# Patient Record
Sex: Female | Born: 1990 | Race: White | Hispanic: No | Marital: Single | Smoking: Never smoker
Health system: Southern US, Community
[De-identification: ages and names within clinical notes are randomized; demographics above are authoritative.]

## PROBLEM LIST (undated history)

## (undated) ENCOUNTER — Emergency Department (HOSPITAL_BASED_OUTPATIENT_CLINIC_OR_DEPARTMENT_OTHER): Admission: EM

## (undated) DIAGNOSIS — F32A Depression, unspecified: Secondary | ICD-10-CM

---
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
## 2012-01-07 ENCOUNTER — Ambulatory Visit: Payer: BC Managed Care – PPO | Admitting: Psychology

## 2012-01-07 DIAGNOSIS — F909 Attention-deficit hyperactivity disorder, unspecified type: Secondary | ICD-10-CM

## 2012-01-28 ENCOUNTER — Ambulatory Visit (INDEPENDENT_AMBULATORY_CARE_PROVIDER_SITE_OTHER): Payer: BC Managed Care – PPO | Admitting: Psychology

## 2012-01-28 DIAGNOSIS — F4320 Adjustment disorder, unspecified: Secondary | ICD-10-CM

## 2012-02-04 ENCOUNTER — Ambulatory Visit (INDEPENDENT_AMBULATORY_CARE_PROVIDER_SITE_OTHER): Payer: BC Managed Care – PPO | Admitting: Psychology

## 2012-02-04 DIAGNOSIS — F4323 Adjustment disorder with mixed anxiety and depressed mood: Secondary | ICD-10-CM

## 2012-02-11 ENCOUNTER — Ambulatory Visit (INDEPENDENT_AMBULATORY_CARE_PROVIDER_SITE_OTHER): Payer: BC Managed Care – PPO | Admitting: Psychology

## 2012-02-11 DIAGNOSIS — F4323 Adjustment disorder with mixed anxiety and depressed mood: Secondary | ICD-10-CM

## 2012-02-12 ENCOUNTER — Institutional Professional Consult (permissible substitution): Payer: BC Managed Care – PPO | Admitting: Pediatrics

## 2012-03-04 ENCOUNTER — Institutional Professional Consult (permissible substitution) (INDEPENDENT_AMBULATORY_CARE_PROVIDER_SITE_OTHER): Payer: BC Managed Care – PPO | Admitting: Pediatrics

## 2012-03-04 DIAGNOSIS — R279 Unspecified lack of coordination: Secondary | ICD-10-CM

## 2012-03-04 DIAGNOSIS — F909 Attention-deficit hyperactivity disorder, unspecified type: Secondary | ICD-10-CM

## 2012-03-08 ENCOUNTER — Ambulatory Visit (INDEPENDENT_AMBULATORY_CARE_PROVIDER_SITE_OTHER): Payer: BC Managed Care – PPO | Admitting: Psychology

## 2012-03-08 DIAGNOSIS — F4323 Adjustment disorder with mixed anxiety and depressed mood: Secondary | ICD-10-CM

## 2012-03-09 ENCOUNTER — Ambulatory Visit (INDEPENDENT_AMBULATORY_CARE_PROVIDER_SITE_OTHER): Payer: BC Managed Care – PPO | Admitting: Psychology

## 2012-03-09 DIAGNOSIS — F4323 Adjustment disorder with mixed anxiety and depressed mood: Secondary | ICD-10-CM

## 2012-03-17 ENCOUNTER — Ambulatory Visit (INDEPENDENT_AMBULATORY_CARE_PROVIDER_SITE_OTHER): Payer: BC Managed Care – PPO | Admitting: Psychology

## 2012-03-17 DIAGNOSIS — F4323 Adjustment disorder with mixed anxiety and depressed mood: Secondary | ICD-10-CM

## 2012-03-24 ENCOUNTER — Ambulatory Visit: Payer: BC Managed Care – PPO | Admitting: Psychology

## 2012-03-24 ENCOUNTER — Ambulatory Visit (INDEPENDENT_AMBULATORY_CARE_PROVIDER_SITE_OTHER): Payer: BC Managed Care – PPO | Admitting: Psychology

## 2012-03-24 DIAGNOSIS — F4323 Adjustment disorder with mixed anxiety and depressed mood: Secondary | ICD-10-CM

## 2012-03-31 ENCOUNTER — Ambulatory Visit (INDEPENDENT_AMBULATORY_CARE_PROVIDER_SITE_OTHER): Payer: BC Managed Care – PPO | Admitting: Psychology

## 2012-03-31 DIAGNOSIS — F4323 Adjustment disorder with mixed anxiety and depressed mood: Secondary | ICD-10-CM

## 2012-04-07 ENCOUNTER — Ambulatory Visit (INDEPENDENT_AMBULATORY_CARE_PROVIDER_SITE_OTHER): Payer: BC Managed Care – PPO | Admitting: Psychology

## 2012-04-07 DIAGNOSIS — F4323 Adjustment disorder with mixed anxiety and depressed mood: Secondary | ICD-10-CM

## 2012-04-14 ENCOUNTER — Ambulatory Visit (INDEPENDENT_AMBULATORY_CARE_PROVIDER_SITE_OTHER): Payer: BC Managed Care – PPO | Admitting: Psychology

## 2012-04-14 DIAGNOSIS — F4323 Adjustment disorder with mixed anxiety and depressed mood: Secondary | ICD-10-CM

## 2012-04-28 ENCOUNTER — Ambulatory Visit: Payer: BC Managed Care – PPO | Admitting: Psychology

## 2012-04-28 DIAGNOSIS — F4323 Adjustment disorder with mixed anxiety and depressed mood: Secondary | ICD-10-CM

## 2012-05-12 ENCOUNTER — Ambulatory Visit (INDEPENDENT_AMBULATORY_CARE_PROVIDER_SITE_OTHER): Payer: BC Managed Care – PPO | Admitting: Psychology

## 2012-05-12 DIAGNOSIS — F4323 Adjustment disorder with mixed anxiety and depressed mood: Secondary | ICD-10-CM

## 2012-05-26 ENCOUNTER — Ambulatory Visit (INDEPENDENT_AMBULATORY_CARE_PROVIDER_SITE_OTHER): Payer: BC Managed Care – PPO | Admitting: Psychology

## 2012-05-26 DIAGNOSIS — F4323 Adjustment disorder with mixed anxiety and depressed mood: Secondary | ICD-10-CM

## 2012-06-04 ENCOUNTER — Institutional Professional Consult (permissible substitution) (INDEPENDENT_AMBULATORY_CARE_PROVIDER_SITE_OTHER): Payer: BC Managed Care – PPO | Admitting: Pediatrics

## 2012-06-04 DIAGNOSIS — F909 Attention-deficit hyperactivity disorder, unspecified type: Secondary | ICD-10-CM

## 2012-06-04 DIAGNOSIS — R279 Unspecified lack of coordination: Secondary | ICD-10-CM

## 2012-06-16 ENCOUNTER — Ambulatory Visit (INDEPENDENT_AMBULATORY_CARE_PROVIDER_SITE_OTHER): Payer: BC Managed Care – PPO | Admitting: Psychology

## 2012-06-16 DIAGNOSIS — F4320 Adjustment disorder, unspecified: Secondary | ICD-10-CM

## 2012-07-20 ENCOUNTER — Ambulatory Visit (INDEPENDENT_AMBULATORY_CARE_PROVIDER_SITE_OTHER): Payer: BC Managed Care – PPO | Admitting: Psychology

## 2012-07-20 DIAGNOSIS — F4323 Adjustment disorder with mixed anxiety and depressed mood: Secondary | ICD-10-CM

## 2012-08-10 ENCOUNTER — Ambulatory Visit (INDEPENDENT_AMBULATORY_CARE_PROVIDER_SITE_OTHER): Payer: BC Managed Care – PPO | Admitting: Psychology

## 2012-08-10 DIAGNOSIS — F4323 Adjustment disorder with mixed anxiety and depressed mood: Secondary | ICD-10-CM

## 2012-08-24 ENCOUNTER — Ambulatory Visit: Payer: BC Managed Care – PPO | Admitting: Psychology

## 2012-08-24 DIAGNOSIS — F909 Attention-deficit hyperactivity disorder, unspecified type: Secondary | ICD-10-CM

## 2012-08-31 ENCOUNTER — Institutional Professional Consult (permissible substitution) (INDEPENDENT_AMBULATORY_CARE_PROVIDER_SITE_OTHER): Payer: BC Managed Care – PPO | Admitting: Pediatrics

## 2012-08-31 DIAGNOSIS — F909 Attention-deficit hyperactivity disorder, unspecified type: Secondary | ICD-10-CM

## 2012-08-31 DIAGNOSIS — R279 Unspecified lack of coordination: Secondary | ICD-10-CM

## 2012-11-19 ENCOUNTER — Institutional Professional Consult (permissible substitution) (INDEPENDENT_AMBULATORY_CARE_PROVIDER_SITE_OTHER): Payer: BC Managed Care – PPO | Admitting: Pediatrics

## 2012-11-19 DIAGNOSIS — F909 Attention-deficit hyperactivity disorder, unspecified type: Secondary | ICD-10-CM

## 2012-11-19 DIAGNOSIS — R279 Unspecified lack of coordination: Secondary | ICD-10-CM

## 2013-03-08 ENCOUNTER — Institutional Professional Consult (permissible substitution): Payer: BC Managed Care – PPO | Admitting: Pediatrics

## 2013-03-08 ENCOUNTER — Institutional Professional Consult (permissible substitution) (INDEPENDENT_AMBULATORY_CARE_PROVIDER_SITE_OTHER): Payer: BC Managed Care – PPO | Admitting: Pediatrics

## 2013-03-08 DIAGNOSIS — F909 Attention-deficit hyperactivity disorder, unspecified type: Secondary | ICD-10-CM

## 2013-03-08 DIAGNOSIS — R279 Unspecified lack of coordination: Secondary | ICD-10-CM

## 2013-06-28 ENCOUNTER — Institutional Professional Consult (permissible substitution) (INDEPENDENT_AMBULATORY_CARE_PROVIDER_SITE_OTHER): Payer: BC Managed Care – PPO | Admitting: Pediatrics

## 2013-06-28 DIAGNOSIS — R279 Unspecified lack of coordination: Secondary | ICD-10-CM

## 2013-06-28 DIAGNOSIS — F909 Attention-deficit hyperactivity disorder, unspecified type: Secondary | ICD-10-CM

## 2015-01-05 ENCOUNTER — Other Ambulatory Visit (HOSPITAL_COMMUNITY)
Admission: RE | Admit: 2015-01-05 | Discharge: 2015-01-05 | Disposition: A | Discharge status: Home / Self Care | Payer: 59 | Source: Ambulatory Visit | Attending: Family Medicine | Admitting: Family Medicine

## 2015-01-05 ENCOUNTER — Other Ambulatory Visit: Payer: Self-pay | Admitting: Physician Assistant

## 2015-01-05 DIAGNOSIS — Z01419 Encounter for gynecological examination (general) (routine) without abnormal findings: Secondary | ICD-10-CM | POA: Diagnosis present

## 2015-01-09 LAB — CYTOLOGY - PAP

## 2015-01-13 ENCOUNTER — Emergency Department (HOSPITAL_COMMUNITY): Payer: 59

## 2015-01-13 ENCOUNTER — Emergency Department (HOSPITAL_COMMUNITY)
Admission: EM | Admit: 2015-01-13 | Discharge: 2015-01-13 | Disposition: A | Discharge status: Home / Self Care | Payer: 59 | Attending: Emergency Medicine | Admitting: Emergency Medicine

## 2015-01-13 ENCOUNTER — Encounter (HOSPITAL_COMMUNITY): Payer: Self-pay | Admitting: Nurse Practitioner

## 2015-01-13 DIAGNOSIS — Y92410 Unspecified street and highway as the place of occurrence of the external cause: Secondary | ICD-10-CM | POA: Insufficient documentation

## 2015-01-13 DIAGNOSIS — S4992XA Unspecified injury of left shoulder and upper arm, initial encounter: Secondary | ICD-10-CM | POA: Insufficient documentation

## 2015-01-13 DIAGNOSIS — S29001A Unspecified injury of muscle and tendon of front wall of thorax, initial encounter: Secondary | ICD-10-CM | POA: Insufficient documentation

## 2015-01-13 DIAGNOSIS — Z79899 Other long term (current) drug therapy: Secondary | ICD-10-CM | POA: Diagnosis not present

## 2015-01-13 DIAGNOSIS — Z3202 Encounter for pregnancy test, result negative: Secondary | ICD-10-CM | POA: Diagnosis not present

## 2015-01-13 DIAGNOSIS — S8992XA Unspecified injury of left lower leg, initial encounter: Secondary | ICD-10-CM | POA: Insufficient documentation

## 2015-01-13 DIAGNOSIS — Y998 Other external cause status: Secondary | ICD-10-CM | POA: Diagnosis not present

## 2015-01-13 DIAGNOSIS — Y9389 Activity, other specified: Secondary | ICD-10-CM | POA: Diagnosis not present

## 2015-01-13 DIAGNOSIS — S3991XA Unspecified injury of abdomen, initial encounter: Secondary | ICD-10-CM | POA: Diagnosis not present

## 2015-01-13 LAB — CBC WITH DIFFERENTIAL/PLATELET
Basophils Absolute: 0 10*3/uL (ref 0.0–0.1)
Basophils Relative: 0 % (ref 0–1)
Eosinophils Absolute: 0 10*3/uL (ref 0.0–0.7)
Eosinophils Relative: 0 % (ref 0–5)
HCT: 40.6 % (ref 36.0–46.0)
Hemoglobin: 14 g/dL (ref 12.0–15.0)
Lymphocytes Relative: 16 % (ref 12–46)
Lymphs Abs: 2.6 10*3/uL (ref 0.7–4.0)
MCH: 29 pg (ref 26.0–34.0)
MCHC: 34.5 g/dL (ref 30.0–36.0)
MCV: 84.2 fL (ref 78.0–100.0)
Monocytes Absolute: 0.9 10*3/uL (ref 0.1–1.0)
Monocytes Relative: 6 % (ref 3–12)
Neutro Abs: 12.5 10*3/uL — ABNORMAL HIGH (ref 1.7–7.7)
Neutrophils Relative %: 78 % — ABNORMAL HIGH (ref 43–77)
Platelets: 327 10*3/uL (ref 150–400)
RBC: 4.82 MIL/uL (ref 3.87–5.11)
RDW: 13 % (ref 11.5–15.5)
WBC: 16 10*3/uL — ABNORMAL HIGH (ref 4.0–10.5)

## 2015-01-13 LAB — URINALYSIS, ROUTINE W REFLEX MICROSCOPIC
Bilirubin Urine: NEGATIVE
Glucose, UA: NEGATIVE mg/dL
Hgb urine dipstick: NEGATIVE
Ketones, ur: 15 mg/dL — AB
Nitrite: NEGATIVE
Protein, ur: 30 mg/dL — AB
Specific Gravity, Urine: 1.025 (ref 1.005–1.030)
Urobilinogen, UA: 0.2 mg/dL (ref 0.0–1.0)
pH: 5.5 (ref 5.0–8.0)

## 2015-01-13 LAB — COMPREHENSIVE METABOLIC PANEL
ALT: 22 U/L (ref 14–54)
AST: 19 U/L (ref 15–41)
Albumin: 3.7 g/dL (ref 3.5–5.0)
Alkaline Phosphatase: 52 U/L (ref 38–126)
Anion gap: 8 (ref 5–15)
BUN: 11 mg/dL (ref 6–20)
CO2: 23 mmol/L (ref 22–32)
Calcium: 9 mg/dL (ref 8.9–10.3)
Chloride: 107 mmol/L (ref 101–111)
Creatinine, Ser: 0.81 mg/dL (ref 0.44–1.00)
GFR calc Af Amer: >60 mL/min (ref >60)
GFR calc non Af Amer: >60 mL/min (ref >60)
Glucose, Bld: 85 mg/dL (ref 65–99)
Potassium: 3.5 mmol/L (ref 3.5–5.1)
Sodium: 138 mmol/L (ref 135–145)
Total Bilirubin: 0.6 mg/dL (ref 0.3–1.2)
Total Protein: 6.9 g/dL (ref 6.5–8.1)

## 2015-01-13 LAB — URINE MICROSCOPIC-ADD ON

## 2015-01-13 LAB — POC URINE PREG, ED: Preg Test, Ur: NEGATIVE

## 2015-01-13 MED ORDER — IOHEXOL 300 MG/ML  SOLN
80.0000 mL | Freq: Once | INTRAMUSCULAR | Status: AC | PRN
  Administered 2015-01-13: 80 mL via INTRAVENOUS

## 2015-01-13 MED ORDER — HYDROCODONE-ACETAMINOPHEN 5-325 MG PO TABS: 1.0000 | ORAL_TABLET | ORAL | Status: DC | PRN

## 2015-01-13 MED ORDER — KETOROLAC TROMETHAMINE 30 MG/ML IJ SOLN
30.0000 mg | Freq: Once | INTRAMUSCULAR | Status: AC
  Administered 2015-01-13: 30 mg via INTRAVENOUS
  Filled 2015-01-13: qty 1
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
## 2015-01-13 NOTE — ED Notes (Signed)
Pt returned from X-ray.  
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
## 2019-10-15 ENCOUNTER — Ambulatory Visit: Payer: 59 | Attending: Internal Medicine

## 2019-10-15 DIAGNOSIS — Z23 Encounter for immunization: Secondary | ICD-10-CM

## 2019-10-15 NOTE — Progress Notes (Signed)
   Covid-19 Vaccination Clinic  Name:  Stephanie Johnston    MRN: [id number] DOB: 1991/05/06  10/15/2019  Stephanie Johnston was observed post Covid-19 immunization for 15 minutes without incident. She was provided with Vaccine Information Sheet and instruction to access the V-Safe system.   Stephanie Johnston was instructed to call 911 with any severe reactions post vaccine: Marland Kitchen Difficulty breathing  . Swelling of face and throat  . A fast heartbeat  . A bad rash all over body  . Dizziness and weakness   Immunizations Administered    Name Date Dose VIS Date Route   Pfizer COVID-19 Vaccine 10/15/2019  2:50 PM 0.3 mL 06/24/2019 Intramuscular   Manufacturer: [employer], [employer]   Lot: [id number]   NDC: [address]-2

## 2019-11-09 ENCOUNTER — Ambulatory Visit: Payer: 59 | Attending: Internal Medicine

## 2019-11-09 DIAGNOSIS — Z23 Encounter for immunization: Secondary | ICD-10-CM

## 2019-11-09 NOTE — Progress Notes (Signed)
   Covid-19 Vaccination Clinic  Name:  Stephanie Johnston    MRN: [id number] DOB: May 09, 1991  11/09/2019  [name] was observed post Covid-19 immunization for 15 minutes without incident. She was provided with Vaccine Information Sheet and instruction to access the V-Safe system.   [name] was instructed to call 911 with any severe reactions post vaccine: Marland Kitchen Difficulty breathing  . Swelling of face and throat  . A fast heartbeat  . A bad rash all over body  . Dizziness and weakness   Immunizations Administered    Name Date Dose VIS Date Route   Pfizer COVID-19 Vaccine 11/09/2019  3:06 PM 0.3 mL 09/07/2018 Intramuscular   Manufacturer: [employer], [employer]   Lot: [id number]   NDC: [address]-3

## 2022-04-17 DIAGNOSIS — N390 Urinary tract infection, site not specified: Secondary | ICD-10-CM | POA: Diagnosis not present

## 2022-04-17 DIAGNOSIS — R350 Frequency of micturition: Secondary | ICD-10-CM | POA: Diagnosis not present

## 2022-05-22 DIAGNOSIS — M722 Plantar fascial fibromatosis: Secondary | ICD-10-CM | POA: Diagnosis not present

## 2022-05-26 DIAGNOSIS — M722 Plantar fascial fibromatosis: Secondary | ICD-10-CM | POA: Diagnosis not present

## 2022-06-21 DIAGNOSIS — J069 Acute upper respiratory infection, unspecified: Secondary | ICD-10-CM | POA: Diagnosis not present

## 2022-06-30 DIAGNOSIS — R5383 Other fatigue: Secondary | ICD-10-CM | POA: Diagnosis not present

## 2022-06-30 DIAGNOSIS — Z113 Encounter for screening for infections with a predominantly sexual mode of transmission: Secondary | ICD-10-CM | POA: Diagnosis not present

## 2022-06-30 DIAGNOSIS — F419 Anxiety disorder, unspecified: Secondary | ICD-10-CM | POA: Diagnosis not present

## 2022-06-30 DIAGNOSIS — Z124 Encounter for screening for malignant neoplasm of cervix: Secondary | ICD-10-CM | POA: Diagnosis not present

## 2022-06-30 DIAGNOSIS — F902 Attention-deficit hyperactivity disorder, combined type: Secondary | ICD-10-CM | POA: Diagnosis not present

## 2022-06-30 DIAGNOSIS — F321 Major depressive disorder, single episode, moderate: Secondary | ICD-10-CM | POA: Diagnosis not present

## 2022-06-30 DIAGNOSIS — Z0000 Encounter for general adult medical examination without abnormal findings: Secondary | ICD-10-CM | POA: Diagnosis not present

## 2022-06-30 DIAGNOSIS — Z23 Encounter for immunization: Secondary | ICD-10-CM | POA: Diagnosis not present

## 2022-06-30 DIAGNOSIS — Z13220 Encounter for screening for lipoid disorders: Secondary | ICD-10-CM | POA: Diagnosis not present

## 2022-08-08 DIAGNOSIS — F902 Attention-deficit hyperactivity disorder, combined type: Secondary | ICD-10-CM | POA: Diagnosis not present

## 2022-08-08 DIAGNOSIS — F411 Generalized anxiety disorder: Secondary | ICD-10-CM | POA: Diagnosis not present

## 2022-08-08 DIAGNOSIS — F321 Major depressive disorder, single episode, moderate: Secondary | ICD-10-CM | POA: Diagnosis not present

## 2022-08-08 DIAGNOSIS — Z30011 Encounter for initial prescription of contraceptive pills: Secondary | ICD-10-CM | POA: Diagnosis not present

## 2022-09-09 DIAGNOSIS — F321 Major depressive disorder, single episode, moderate: Secondary | ICD-10-CM | POA: Diagnosis not present

## 2022-09-09 DIAGNOSIS — F411 Generalized anxiety disorder: Secondary | ICD-10-CM | POA: Diagnosis not present

## 2022-09-09 DIAGNOSIS — F902 Attention-deficit hyperactivity disorder, combined type: Secondary | ICD-10-CM | POA: Diagnosis not present

## 2022-10-08 DIAGNOSIS — F411 Generalized anxiety disorder: Secondary | ICD-10-CM | POA: Diagnosis not present

## 2022-10-08 DIAGNOSIS — F321 Major depressive disorder, single episode, moderate: Secondary | ICD-10-CM | POA: Diagnosis not present

## 2022-10-08 DIAGNOSIS — F902 Attention-deficit hyperactivity disorder, combined type: Secondary | ICD-10-CM | POA: Diagnosis not present

## 2022-12-23 DIAGNOSIS — F321 Major depressive disorder, single episode, moderate: Secondary | ICD-10-CM | POA: Diagnosis not present

## 2022-12-23 DIAGNOSIS — Z87440 Personal history of urinary (tract) infections: Secondary | ICD-10-CM | POA: Diagnosis not present

## 2022-12-23 DIAGNOSIS — F411 Generalized anxiety disorder: Secondary | ICD-10-CM | POA: Diagnosis not present

## 2022-12-23 DIAGNOSIS — F902 Attention-deficit hyperactivity disorder, combined type: Secondary | ICD-10-CM | POA: Diagnosis not present

## 2023-03-27 DIAGNOSIS — S99911A Unspecified injury of right ankle, initial encounter: Secondary | ICD-10-CM | POA: Diagnosis not present

## 2023-05-21 DIAGNOSIS — Z3009 Encounter for other general counseling and advice on contraception: Secondary | ICD-10-CM | POA: Diagnosis not present

## 2023-05-25 DIAGNOSIS — Z309 Encounter for contraceptive management, unspecified: Secondary | ICD-10-CM | POA: Diagnosis not present

## 2023-05-29 DIAGNOSIS — F321 Major depressive disorder, single episode, moderate: Secondary | ICD-10-CM | POA: Diagnosis not present

## 2023-05-29 DIAGNOSIS — F419 Anxiety disorder, unspecified: Secondary | ICD-10-CM | POA: Diagnosis not present

## 2023-05-29 DIAGNOSIS — F902 Attention-deficit hyperactivity disorder, combined type: Secondary | ICD-10-CM | POA: Diagnosis not present

## 2023-08-18 DIAGNOSIS — J069 Acute upper respiratory infection, unspecified: Secondary | ICD-10-CM | POA: Diagnosis not present

## 2023-08-25 DIAGNOSIS — J069 Acute upper respiratory infection, unspecified: Secondary | ICD-10-CM | POA: Diagnosis not present

## 2023-08-25 DIAGNOSIS — R051 Acute cough: Secondary | ICD-10-CM | POA: Diagnosis not present

## 2023-10-06 DIAGNOSIS — Z309 Encounter for contraceptive management, unspecified: Secondary | ICD-10-CM | POA: Diagnosis not present

## 2023-10-22 ENCOUNTER — Encounter (HOSPITAL_COMMUNITY): Payer: Self-pay | Admitting: Obstetrics and Gynecology

## 2023-10-22 NOTE — Progress Notes (Signed)
 Spoke w/ via phone for pre-op interview--- [facility] needs dos---- UPT per anesthesia. Surgeon orders requested 10/20/23.        Lab results------ COVID test -----patient states asymptomatic no test needed Arrive at -------0950 NPO after MN NO Solid Food.  Clear liquids from MN until---0850 Pre-Surgery Ensure or G2:  Med rec completed Medications to take morning of surgery ----- Wellbutrin and Xanax PRN Diabetic medication -----  GLP1 agonist last dose: GLP1 instructions:  Patient instructed no nail polish to be worn day of surgery Patient instructed to bring photo id and insurance card day of surgery Patient aware to have Driver (ride ) / caregiver    for 24 hours after surgery - Mother Val Wormley Patient Special Instructions ----- shower with antibacterial soap Pre-Op special Instructions -----  Patient verbalized understanding of instructions that were given at this phone interview. Patient denies chest pain, sob, fever, cough at the interview.

## 2023-10-27 ENCOUNTER — Other Ambulatory Visit: Payer: Self-pay | Admitting: Obstetrics and Gynecology

## 2023-10-27 DIAGNOSIS — Z302 Encounter for sterilization: Secondary | ICD-10-CM

## 2023-10-27 NOTE — Anesthesia Preprocedure Evaluation (Signed)
 Anesthesia Evaluation  Patient identified by MRN, date of birth, ID band Patient awake    Reviewed: Allergy & Precautions, NPO status , Patient's Chart, lab work & pertinent test results  Airway Mallampati: II  TM Distance: >3 FB Neck ROM: Full    Dental no notable dental hx. (+) Teeth Intact, Dental Advisory Given   Pulmonary neg pulmonary ROS   Pulmonary exam normal breath sounds clear to auscultation       Cardiovascular negative cardio ROS Normal cardiovascular exam Rhythm:Regular Rate:Normal     Neuro/Psych  PSYCHIATRIC DISORDERS  Depression    negative neurological ROS     GI/Hepatic negative GI ROS, Neg liver ROS,,,  Endo/Other  Obesity BMI 31  Renal/GU negative Renal ROS  negative genitourinary   Musculoskeletal negative musculoskeletal ROS (+)    Abdominal   Peds  Hematology negative hematology ROS (+)   Anesthesia Other Findings   Reproductive/Obstetrics Desires sterility                             Anesthesia Physical Anesthesia Plan  ASA: 2  Anesthesia Plan: General   Post-op Pain Management: Tylenol PO (pre-op)*, Toradol IV (intra-op)* and Precedex   Induction: Intravenous  PONV Risk Score and Plan: 4 or greater and Ondansetron, Dexamethasone, Scopolamine patch - Pre-op, Midazolam and Treatment may vary due to age or medical condition  Airway Management Planned: Oral ETT  Additional Equipment: None  Intra-op Plan:   Post-operative Plan: Extubation in OR  Informed Consent:   Plan Discussed with:   Anesthesia Plan Comments:        Anesthesia Quick Evaluation

## 2023-10-27 NOTE — H&P (Deleted)
   The note originally documented on this encounter has been moved the the encounter in which it belongs.

## 2023-10-27 NOTE — H&P (Signed)
 General:  33 y/o presents for preop visit. Pt is schedule for laparospopic bilateral salpingectomy on 10/28/2023 for contraception management.   IN REVIEW:  She is currently on JUNEL FE.  She has been on an ocp since the age of 81. She is in a committed relationship. Both she and her partner never want to have chidren.  Current Medications Taking Xanax(ALPRAZolam) 0.5 MG Tablet 1/2 - 1 tablet Orally as needed for panic attacks once a day , Notes to Pharmacist: as needed Junel FE 1.5/30(Norethin Ace-Eth Estrad-FE) 1.5-30 MG-MCG Tablet TAKE 1 TABLET BY MOUTH EVERY DAY Vyvanse(Lisdexamfetamine Dimesylate) 40 MG Capsule 1 capsule in the morning Orally... may fill 30 days after last refill Once a day Wellbutrin XL(buPROPion HCl ER (XL)) 150 MG Tablet Extended Release 24 Hour 1 tablet in the morning Orally Once a day Not-Taking Fluticasone Propionate 50 MCG/ACT Suspension 2 sprays in each nostril Nasally once a day Promethazine-DM 6.25-15 MG/5ML Syrup 5 mL as needed Orally every 6 hrs Naproxen Sodium 220 MG Tablet 2 1/2 tablets by mouth every 12 hrs, as needed. Take with food. OTC Medication List reviewed and reconciled with the patient Past Medical History [facility], [facility] Arabian for meds and Michie Dew. anxiety--[facility]. Raynaud's phenomenon. Surgical History Denies Past Surgical History Family History Father: alive Mother: alive Paternal Grand Father: alive, heart attack at 75, HTN Paternal Grand Mother: alive, HTN Maternal Grand Father: deceased, heart attack at 35, HTN Maternal Grand Mother: alive, heart attack at 69, HTN Maternal uncle: depression Maternal aunt: depression 1 brother(s) - healthy. no FH of breast or uterine cancer. Social History    General:  Tobacco use  cigarettes:  Never smoked, Tobacco history last updated  10/06/2023, Vaping  No. Alcohol: no. Caffeine: yes, very little. Recreational drug use: no. Exercise: nothing structured,  active at work. Marital Status: Single. Children: none. 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 Sexually active: yes, bisexual. Lives with roommate. single. Fun enjoys sleep and time with friends/hangout. Gyn History Sexual activity currently sexually active.  Periods : every month, regular.  LMP 1 week ago.  Birth control ocp.  Last pap smear date 06/30/2022.  Denies H/O Last mammogram date.  Denies H/O Abnormal pap smear.  Denies H/O STD.  Menarche 14.  OB History Never been pregnant  per patient.  Allergies N.K.D.A. Hospitalization/Major Diagnostic Procedure Denies Past Hospitalization Review of Systems    CONSTITUTIONAL:  Chills No.  Fatigue No.  Fever No.  Night sweats No.  Recent travel outside [place]  No.  Sweats No.  Weight change No.     OPHTHALMOLOGY:  Blurring of vision no.  Change in vision no.  Double vision no.     ENT:  Dizziness no.  Nose bleeds no.  Sore throat no.  Teeth pain no.     ALLERGY:  Hives no.     CARDIOLOGY:  Chest pain no.  High blood pressure no.  Irregular heart beat no.  Leg edema no.  Palpitations no.     RESPIRATORY:  Shortness of breath no.  Cough no.  Wheezing no.     UROLOGY:  Pain with urination no.  Urinary urgency no.  Urinary frequency no.  Urinary incontinence no.  Difficulty urinating No.  Blood in urine No.     GASTROENTEROLOGY:  Abdominal pain no.  Appetite change no.  Bloating/belching no.  Blood in stool or on toilet paper no.  Change in bowel movements no.  Constipation no.  Diarrhea no.  Difficulty swallowing no.  Nausea no.     FEMALE REPRODUCTIVE:  Vulvar pain no.  Vulvar rash no.  Abnormal vaginal bleeding no.  Breast pain no.  Nipple discharge no.  Pain with intercourse no.  Pelvic pain no.  Unusual vaginal discharge no.  Vaginal itching no.     MUSCULOSKELETAL:  Muscle aches no.     NEUROLOGY:  Headache no.  Tingling/numbness no.  Weakness no.     PSYCHOLOGY:  Depression no.  Anxiety no.   Nervousness no.  Sleep disturbances no.  Suicidal ideation no .     ENDOCRINOLOGY:  Excessive thirst no.  Excessive urination no.  Hair loss no.  Heat or cold intolerance no.     HEMATOLOGY/LYMPH:  Abnormal bleeding no.  Easy bruising no.  Swollen glands no.     DERMATOLOGY:  New/changing skin lesion no.  Rash no.  Sores no.  Vital Signs Wt: 194.4, Wt change: 1.8 lbs, Ht: 65, BMI: 32.35, Pulse sitting: 87, BP sitting: 124/83. Examination    General Examination: CONSTITUTIONAL:  alert, oriented, NAD.  SKIN:  moist, warm.  EYES:  Conjunctiva clear.  LUNGS:  good I:E efffort noted, clear to auscultation bilaterally.  HEART:  regular rate and rhythm.  ABDOMEN:  soft, non-tender/non-distended, bowel sounds present.  FEMALE GENITOURINARY:  normal external genitalia, labia - unremarkable, vagina - pink moist mucosa, no lesions or abnormal discharge, cervix - no discharge or lesions or CMT, adnexa - no masses or tenderness, uterus - nontender and normal size on palpation.  EXTREMITIES:  no clubbing, no edema.  PSYCH:  affect normal, good eye contact.  Physical Examination    Chaperone present:  Chaperone present  Shermon Divine 10/06/2023 10:34:57 AM >, for pelvic exam.  Assessments 1. Contraception management - Z30.9 (Primary)    Treatment 1. Contraception management        Notes: she desires permanent sterilization via laparoscopic bilateral salpingectomy. R/B/A of surgery discussed including but not limited to infection, bleeding, damage to bowel and surrounding organs with the need for futher surgery. She wil be unable to drive for 3-5 days after surgery or while requiring narcotics for pain. she should avoid lifting over 10 lbs for 2 weeks. she is advised to avoid eating after midnight the night prior to surgery. she should avoid NSAIDS 2 weeks prior to surgery. She voiced understanding of the above and desires to proceed with laparoscopic biltateral salpingectomy

## 2023-10-28 ENCOUNTER — Encounter (HOSPITAL_COMMUNITY): Payer: Self-pay | Admitting: Obstetrics and Gynecology

## 2023-10-28 ENCOUNTER — Ambulatory Visit (HOSPITAL_COMMUNITY): Admitting: Anesthesiology

## 2023-10-28 ENCOUNTER — Other Ambulatory Visit: Payer: Self-pay

## 2023-10-28 ENCOUNTER — Ambulatory Visit (HOSPITAL_COMMUNITY)
Admission: RE | Admit: 2023-10-28 | Discharge: 2023-10-28 | Disposition: A | Discharge status: Home / Self Care | Payer: Self-pay | Source: Ambulatory Visit | Attending: Obstetrics and Gynecology | Admitting: Obstetrics and Gynecology

## 2023-10-28 ENCOUNTER — Encounter (HOSPITAL_COMMUNITY)
Admission: RE | Disposition: A | Discharge status: Home / Self Care | Payer: Self-pay | Source: Ambulatory Visit | Attending: Obstetrics and Gynecology

## 2023-10-28 DIAGNOSIS — Z01818 Encounter for other preprocedural examination: Secondary | ICD-10-CM

## 2023-10-28 DIAGNOSIS — Z302 Encounter for sterilization: Secondary | ICD-10-CM | POA: Insufficient documentation

## 2023-10-28 DIAGNOSIS — F32A Depression, unspecified: Secondary | ICD-10-CM | POA: Insufficient documentation

## 2023-10-28 DIAGNOSIS — E669 Obesity, unspecified: Secondary | ICD-10-CM | POA: Diagnosis not present

## 2023-10-28 DIAGNOSIS — Z793 Long term (current) use of hormonal contraceptives: Secondary | ICD-10-CM | POA: Insufficient documentation

## 2023-10-28 DIAGNOSIS — Z6831 Body mass index (BMI) 31.0-31.9, adult: Secondary | ICD-10-CM | POA: Diagnosis not present

## 2023-10-28 HISTORY — DX: Depression, unspecified: F32.A

## 2023-10-28 HISTORY — PX: LAPAROSCOPIC BILATERAL SALPINGECTOMY: SHX5889

## 2023-10-28 LAB — CBC
HCT: 42.9 % (ref 36.0–46.0)
Hemoglobin: 14.9 g/dL (ref 12.0–15.0)
MCH: 29.7 pg (ref 26.0–34.0)
MCHC: 34.7 g/dL (ref 30.0–36.0)
MCV: 85.5 fL (ref 80.0–100.0)
Platelets: 390 10*3/uL (ref 150–400)
RBC: 5.02 MIL/uL (ref 3.87–5.11)
RDW: 12.2 % (ref 11.5–15.5)
WBC: 6.9 10*3/uL (ref 4.0–10.5)
nRBC: 0 % (ref 0.0–0.2)

## 2023-10-28 LAB — POCT PREGNANCY, URINE: Preg Test, Ur: NEGATIVE

## 2023-10-28 SURGERY — SALPINGECTOMY, BILATERAL, LAPAROSCOPIC
Anesthesia: General | Site: Pelvis | Laterality: Bilateral

## 2023-10-28 MED ORDER — ONDANSETRON HCL 4 MG/2ML IJ SOLN
INTRAMUSCULAR | Status: DC | PRN
  Administered 2023-10-28: 4 mg via INTRAVENOUS

## 2023-10-28 MED ORDER — DEXAMETHASONE SODIUM PHOSPHATE 10 MG/ML IJ SOLN
INTRAMUSCULAR | Status: DC | PRN
  Administered 2023-10-28: 8 mg via INTRAVENOUS

## 2023-10-28 MED ORDER — FENTANYL CITRATE (PF) 250 MCG/5ML IJ SOLN
INTRAMUSCULAR | Status: DC | PRN
  Administered 2023-10-28: 100 ug via INTRAVENOUS

## 2023-10-28 MED ORDER — PROPOFOL 10 MG/ML IV BOLUS
INTRAVENOUS | Status: AC
  Filled 2023-10-28: qty 20

## 2023-10-28 MED ORDER — SUGAMMADEX SODIUM 200 MG/2ML IV SOLN
INTRAVENOUS | Status: DC | PRN
  Administered 2023-10-28: 200 mg via INTRAVENOUS

## 2023-10-28 MED ORDER — IBUPROFEN 800 MG PO TABS: 800.0000 mg | ORAL_TABLET | Freq: Three times a day (TID) | ORAL | 0 refills | Status: AC | PRN

## 2023-10-28 MED ORDER — ROCURONIUM BROMIDE 10 MG/ML (PF) SYRINGE
PREFILLED_SYRINGE | INTRAVENOUS | Status: AC
  Filled 2023-10-28: qty 10

## 2023-10-28 MED ORDER — SODIUM CHLORIDE 0.9 % IV SOLN
2.0000 g | INTRAVENOUS | Status: AC
  Administered 2023-10-28: 2 g via INTRAVENOUS
  Filled 2023-10-28: qty 2

## 2023-10-28 MED ORDER — OXYCODONE HCL 5 MG PO TABS
ORAL_TABLET | ORAL | Status: AC
  Filled 2023-10-28: qty 1

## 2023-10-28 MED ORDER — ACETAMINOPHEN 500 MG PO TABS: 1000.0000 mg | ORAL_TABLET | Freq: Three times a day (TID) | ORAL | 0 refills | Status: AC | PRN

## 2023-10-28 MED ORDER — DIPHENHYDRAMINE HCL 50 MG/ML IJ SOLN
INTRAMUSCULAR | Status: DC | PRN
  Administered 2023-10-28: 12.5 mg via INTRAVENOUS

## 2023-10-28 MED ORDER — MIDAZOLAM HCL 2 MG/2ML IJ SOLN
INTRAMUSCULAR | Status: DC | PRN
  Administered 2023-10-28: 2 mg via INTRAVENOUS

## 2023-10-28 MED ORDER — OXYCODONE HCL 5 MG/5ML PO SOLN: 5.0000 mg | Freq: Once | ORAL | Status: AC | PRN

## 2023-10-28 MED ORDER — ONDANSETRON HCL 4 MG/2ML IJ SOLN: 4.0000 mg | Freq: Once | INTRAMUSCULAR | Status: DC | PRN

## 2023-10-28 MED ORDER — KETOROLAC TROMETHAMINE 30 MG/ML IJ SOLN
INTRAMUSCULAR | Status: AC
  Filled 2023-10-28: qty 1

## 2023-10-28 MED ORDER — 0.9 % SODIUM CHLORIDE (POUR BTL) OPTIME
TOPICAL | Status: DC | PRN
  Administered 2023-10-28: 1000 mL

## 2023-10-28 MED ORDER — SCOPOLAMINE 1 MG/3DAYS TD PT72
MEDICATED_PATCH | TRANSDERMAL | Status: AC
  Filled 2023-10-28: qty 1

## 2023-10-28 MED ORDER — DEXAMETHASONE SODIUM PHOSPHATE 10 MG/ML IJ SOLN
INTRAMUSCULAR | Status: AC
  Filled 2023-10-28: qty 1

## 2023-10-28 MED ORDER — OXYCODONE HCL 5 MG PO TABS: 5.0000 mg | ORAL_TABLET | Freq: Four times a day (QID) | ORAL | 0 refills | Status: AC | PRN

## 2023-10-28 MED ORDER — ORAL CARE MOUTH RINSE: 15.0000 mL | Freq: Once | OROMUCOSAL | Status: AC

## 2023-10-28 MED ORDER — LIDOCAINE 2% (20 MG/ML) 5 ML SYRINGE
INTRAMUSCULAR | Status: DC | PRN
Start: 2023-10-28 — End: 2023-10-28
  Administered 2023-10-28: 80 mg via INTRAVENOUS

## 2023-10-28 MED ORDER — KETOROLAC TROMETHAMINE 30 MG/ML IJ SOLN: 30.0000 mg | Freq: Once | INTRAMUSCULAR | Status: DC | PRN

## 2023-10-28 MED ORDER — AMISULPRIDE (ANTIEMETIC) 5 MG/2ML IV SOLN: 10.0000 mg | Freq: Once | INTRAVENOUS | Status: DC | PRN

## 2023-10-28 MED ORDER — HYDROMORPHONE HCL 1 MG/ML IJ SOLN: 0.2500 mg | INTRAMUSCULAR | Status: DC | PRN

## 2023-10-28 MED ORDER — PROPOFOL 10 MG/ML IV BOLUS
INTRAVENOUS | Status: DC | PRN
  Administered 2023-10-28: 200 mg via INTRAVENOUS

## 2023-10-28 MED ORDER — MIDAZOLAM HCL 2 MG/2ML IJ SOLN
INTRAMUSCULAR | Status: AC
  Filled 2023-10-28: qty 2

## 2023-10-28 MED ORDER — LIDOCAINE 2% (20 MG/ML) 5 ML SYRINGE
INTRAMUSCULAR | Status: AC
Start: 2023-10-28 — End: ?
  Filled 2023-10-28: qty 5

## 2023-10-28 MED ORDER — LACTATED RINGERS IV SOLN: INTRAVENOUS | Status: DC

## 2023-10-28 MED ORDER — ACETAMINOPHEN 500 MG PO TABS
ORAL_TABLET | ORAL | Status: DC
Start: 2023-10-28 — End: 2023-10-28
  Filled 2023-10-28: qty 2

## 2023-10-28 MED ORDER — CHLORHEXIDINE GLUCONATE 0.12 % MT SOLN
15.0000 mL | Freq: Once | OROMUCOSAL | Status: AC
  Administered 2023-10-28: 15 mL via OROMUCOSAL

## 2023-10-28 MED ORDER — MEPERIDINE HCL 25 MG/ML IJ SOLN: 6.2500 mg | INTRAMUSCULAR | Status: DC | PRN

## 2023-10-28 MED ORDER — ACETAMINOPHEN 500 MG PO TABS
1000.0000 mg | ORAL_TABLET | ORAL | Status: AC
  Administered 2023-10-28: 1000 mg via ORAL

## 2023-10-28 MED ORDER — SCOPOLAMINE 1 MG/3DAYS TD PT72
1.0000 | MEDICATED_PATCH | TRANSDERMAL | Status: DC
  Administered 2023-10-28: 1.5 mg via TRANSDERMAL

## 2023-10-28 MED ORDER — ONDANSETRON HCL 4 MG/2ML IJ SOLN
INTRAMUSCULAR | Status: AC
  Filled 2023-10-28: qty 2

## 2023-10-28 MED ORDER — BUPIVACAINE HCL (PF) 0.25 % IJ SOLN
INTRAMUSCULAR | Status: AC
  Filled 2023-10-28: qty 30

## 2023-10-28 MED ORDER — ROCURONIUM BROMIDE 10 MG/ML (PF) SYRINGE
PREFILLED_SYRINGE | INTRAVENOUS | Status: DC | PRN
  Administered 2023-10-28: 15 mg via INTRAVENOUS
  Administered 2023-10-28: 50 mg via INTRAVENOUS

## 2023-10-28 MED ORDER — POVIDONE-IODINE 10 % EX SWAB: 2.0000 | Freq: Once | CUTANEOUS | Status: DC

## 2023-10-28 MED ORDER — BUPIVACAINE HCL (PF) 0.25 % IJ SOLN
INTRAMUSCULAR | Status: DC | PRN
  Administered 2023-10-28: 30 mL

## 2023-10-28 MED ORDER — FENTANYL CITRATE (PF) 250 MCG/5ML IJ SOLN
INTRAMUSCULAR | Status: AC
  Filled 2023-10-28: qty 5

## 2023-10-28 MED ORDER — KETOROLAC TROMETHAMINE 30 MG/ML IJ SOLN
INTRAMUSCULAR | Status: DC | PRN
  Administered 2023-10-28: 30 mg via INTRAVENOUS

## 2023-10-28 MED ORDER — OXYCODONE HCL 5 MG PO TABS
5.0000 mg | ORAL_TABLET | Freq: Once | ORAL | Status: AC | PRN
  Administered 2023-10-28: 5 mg via ORAL

## 2023-10-28 MED ORDER — CHLORHEXIDINE GLUCONATE 0.12 % MT SOLN
OROMUCOSAL | Status: AC
  Filled 2023-10-28: qty 15

## 2023-10-28 SURGICAL SUPPLY — 35 items
APPLICATOR ARISTA FLEXITIP XL (MISCELLANEOUS) IMPLANT
CABLE HIGH FREQUENCY MONO STRZ (ELECTRODE) IMPLANT
DERMABOND ADVANCED .7 DNX12 (GAUZE/BANDAGES/DRESSINGS) IMPLANT
DILATOR CANAL MILEX (MISCELLANEOUS) IMPLANT
DRAPE SURG IRRIG POUCH 19X23 (DRAPES) ×2 IMPLANT
DRSG OPSITE POSTOP 3X4 (GAUZE/BANDAGES/DRESSINGS) IMPLANT
GLOVE BIOGEL PI IND STRL 6.5 (GLOVE) ×2 IMPLANT
GLOVE BIOGEL PI IND STRL 7.0 (GLOVE) ×4 IMPLANT
GLOVE SURG ENC TEXT LTX SZ6.5 (GLOVE) ×4 IMPLANT
GOWN STRL REUS W/ TWL LRG LVL3 (GOWN DISPOSABLE) ×4 IMPLANT
HEMOSTAT ARISTA ABSORB 3G PWDR (HEMOSTASIS) IMPLANT
IRRIG SUCT STRYKERFLOW 2 WTIP (MISCELLANEOUS) IMPLANT
IRRIGATION SUCT STRKRFLW 2 WTP (MISCELLANEOUS) IMPLANT
KIT PINK PAD W/HEAD ARE REST (MISCELLANEOUS) ×1 IMPLANT
KIT PINK PAD W/HEAD ARM REST (MISCELLANEOUS) ×2 IMPLANT
KIT TURNOVER KIT B (KITS) ×2 IMPLANT
LIGASURE LAP MARYLAND 5MM 37CM (ELECTROSURGICAL) IMPLANT
LIGASURE VESSEL 5MM BLUNT TIP (ELECTROSURGICAL) IMPLANT
NS IRRIG 1000ML POUR BTL (IV SOLUTION) ×2 IMPLANT
PACK LAPAROSCOPY BASIN (CUSTOM PROCEDURE TRAY) ×2 IMPLANT
POUCH LAPAROSCOPIC INSTRUMENT (MISCELLANEOUS) ×2 IMPLANT
SET TUBE SMOKE EVAC HIGH FLOW (TUBING) ×2 IMPLANT
SHEARS HARMONIC ACE PLUS 36CM (ENDOMECHANICALS) IMPLANT
SLEEVE ADV FIXATION 5X100MM (TROCAR) ×2 IMPLANT
SOL ELECTROSURG ANTI STICK (MISCELLANEOUS) IMPLANT
SOLUTION ELECTROSURG ANTI STCK (MISCELLANEOUS) IMPLANT
SUT VICRYL 0 UR6 27IN ABS (SUTURE) IMPLANT
SUT VICRYL 4-0 PS2 18IN ABS (SUTURE) ×2 IMPLANT
SYS BAG RETRIEVAL 10MM (BASKET) IMPLANT
SYSTEM BAG RETRIEVAL 10MM (BASKET) IMPLANT
TOWEL GREEN STERILE FF (TOWEL DISPOSABLE) ×4 IMPLANT
TRAY FOLEY W/BAG SLVR 14FR (SET/KITS/TRAYS/PACK) ×2 IMPLANT
TROCAR ADV FIXATION 11X100MM (TROCAR) IMPLANT
TROCAR ADV FIXATION 5X100MM (TROCAR) ×2 IMPLANT
WARMER LAPAROSCOPE (MISCELLANEOUS) ×2 IMPLANT

## 2023-10-28 NOTE — Op Note (Signed)
 10/28/2023  12:08 PM  PATIENT:  Stephanie Johnston  33 y.o. female  PRE-OPERATIVE DIAGNOSIS:  Sterilization  POST-OPERATIVE DIAGNOSIS:  Sterilization  PROCEDURE:  Procedure(s): SALPINGECTOMY, BILATERAL, LAPAROSCOPIC (Bilateral)  SURGEON:  Surgeons and Role:    Arlee Lace, MD - Primary  PHYSICIAN ASSISTANT: None  ASSISTANTS: Chasity Steward RNFA assisted. An experienced assistant was required given the standard of surgical care given the complexity of the case.  This assistant was needed for exposure, dissection, suctioning, retraction, instrument exchange, assisting with delivery with administration of fundal pressure, and for overall help during the procedure.    ANESTHESIA:   general  EBL:  5 mL   BLOOD ADMINISTERED:none  DRAINS: Urinary Catheter (Foley)   LOCAL MEDICATIONS USED:  MARCAINE     SPECIMEN:  Source of Specimen:  bilateral fallopian tubes   DISPOSITION OF SPECIMEN:  PATHOLOGY  COUNTS:  YES  TOURNIQUET:  * No tourniquets in log *  DICTATION: .Note written in EPIC  PLAN OF CARE: Discharge to home after PACU  PATIENT DISPOSITION:  PACU - hemodynamically stable.   Delay start of Pharmacological VTE agent (>24hrs) due to surgical blood loss or risk of bleeding: not applicable  Findings: Normal appearing fallopian tubes and ovaries. Normal appearing uterus     Description of each procedure:  After informed consent the patient was taken to the operating room and placed in dorsal supine position where general endotracheal anesthesia was administered and found to be adequate.  She was placed in dorsal lithotomy position.  She was prepped and draped in the usual sterile fashion. A timeout was called and the procedure confirmed. A Hulka uterine manipulator with and a Foley catheter were placed.  The abdomen was entered through a 5mm umbilical incision under direct visualization and a pneumoperitoneum was established.  Right and left lower quadrant ports were placed  under visualization.  The entry point was visualized from an inferior port and atraumatic entry confirmed. The left fallopian tube was removed from its attachment to the normal-appearing left ovary starting at the fimbria and divided along the mesosalpinx to the level of the uterus. The tube was desiccated thoroughly and divided from the uterus. The specimen was removed.  This process was repeated on the contralateral side. Hemostasis confirmed. Bilateral lower quadrant ports withdrawn. The pneumoperitoneum was reduced completely and the camera port withdrawn under visualization. The skin was closed with 4-0 Vicryl in subcuticular fashion. The Hulka tenaculum was removed and the cervix made hemostatic with pressure and silver nitrate.  The patient was returned to dorsal supine position, awakened and extubated in the OR having appeared to tolerate the procedure well.  All sponge, needle, and instrument counts were correct x 2 at the end of the case.

## 2023-10-28 NOTE — H&P (Signed)
 Date of Initial H&P: 10/27/2023  History reviewed, patient examined, no change in status, stable for surgery.

## 2023-10-28 NOTE — Anesthesia Procedure Notes (Signed)
 Procedure Name: Intubation Date/Time: 10/28/2023 11:26 AM  Performed by: Ballard Levels, CRNAPre-anesthesia Checklist: Patient identified, Emergency Drugs available, Suction available and Patient being monitored Patient Re-evaluated:Patient Re-evaluated prior to induction Oxygen Delivery Method: Circle System Utilized Preoxygenation: Pre-oxygenation with 100% oxygen Induction Type: IV induction Ventilation: Mask ventilation without difficulty Laryngoscope Size: Miller and 2 Grade View: Grade I Tube type: Oral Number of attempts: 1 Airway Equipment and Method: Stylet and Oral airway Placement Confirmation: ETT inserted through vocal cords under direct vision, positive ETCO2 and breath sounds checked- equal and bilateral Secured at: 21 cm Tube secured with: Tape Dental Injury: Teeth and Oropharynx as per pre-operative assessment

## 2023-10-28 NOTE — Progress Notes (Signed)
 Talked with patient will call mom to see if she can come earlier to pick her up to be here at 0930. Will call back to verify

## 2023-10-28 NOTE — Transfer of Care (Signed)
 Immediate Anesthesia Transfer of Care Note  Patient: Stephanie Johnston  Procedure(s) Performed: SALPINGECTOMY, BILATERAL, LAPAROSCOPIC (Bilateral: Pelvis)  Patient Location: PACU  Anesthesia Type:General  Level of Consciousness: awake and alert   Airway & Oxygen Therapy: Patient Spontanous Breathing  Post-op Assessment: Report given to RN and Post -op Vital signs reviewed and stable  Post vital signs: Reviewed and stable  Last Vitals:  Vitals Value Taken Time  BP    Temp 36.6 C 10/28/23 1228  Pulse 66 10/28/23 1230  Resp 14 10/28/23 1230  SpO2 99 % 10/28/23 1230  Vitals shown include unfiled device data.  Last Pain:  Vitals:   10/28/23 1000  TempSrc: Oral  PainSc: 0-No pain      Patients Stated Pain Goal: 5 (10/28/23 1000)  Complications: No notable events documented.

## 2023-10-28 NOTE — Discharge Instructions (Signed)

## 2023-10-29 ENCOUNTER — Encounter (HOSPITAL_COMMUNITY): Payer: Self-pay | Admitting: Obstetrics and Gynecology

## 2023-10-29 LAB — SURGICAL PATHOLOGY

## 2023-10-29 NOTE — Anesthesia Postprocedure Evaluation (Signed)
 Anesthesia Post Note  Patient: Stephanie Johnston  Procedure(s) Performed: SALPINGECTOMY, BILATERAL, LAPAROSCOPIC (Bilateral: Pelvis)     Patient location during evaluation: PACU Anesthesia Type: General Level of consciousness: awake and alert Pain management: pain level controlled Vital Signs Assessment: post-procedure vital signs reviewed and stable Respiratory status: spontaneous breathing, nonlabored ventilation, respiratory function stable and patient connected to nasal cannula oxygen Cardiovascular status: blood pressure returned to baseline and stable Postop Assessment: no apparent nausea or vomiting Anesthetic complications: no  No notable events documented.  Last Vitals:  Vitals:   10/28/23 1305 10/28/23 1308  BP: 126/78   Pulse: 69   Resp: 19   Temp:  36.7 C  SpO2: 100%     Last Pain:  Vitals:   10/28/23 1308  TempSrc:   PainSc: 4    Pain Goal: Patients Stated Pain Goal: 5 (10/28/23 1000)                 Careli Luzader L Vianca Bracher

## 2023-11-16 DIAGNOSIS — N943 Premenstrual tension syndrome: Secondary | ICD-10-CM | POA: Diagnosis not present

## 2023-11-16 DIAGNOSIS — Z9889 Other specified postprocedural states: Secondary | ICD-10-CM | POA: Diagnosis not present

## 2023-12-03 DIAGNOSIS — F321 Major depressive disorder, single episode, moderate: Secondary | ICD-10-CM | POA: Diagnosis not present

## 2023-12-03 DIAGNOSIS — F3281 Premenstrual dysphoric disorder: Secondary | ICD-10-CM | POA: Diagnosis not present

## 2023-12-03 DIAGNOSIS — F419 Anxiety disorder, unspecified: Secondary | ICD-10-CM | POA: Diagnosis not present

## 2023-12-03 DIAGNOSIS — F902 Attention-deficit hyperactivity disorder, combined type: Secondary | ICD-10-CM | POA: Diagnosis not present

## 2024-02-02 DIAGNOSIS — R591 Generalized enlarged lymph nodes: Secondary | ICD-10-CM | POA: Diagnosis not present

## 2024-02-02 DIAGNOSIS — R509 Fever, unspecified: Secondary | ICD-10-CM | POA: Diagnosis not present

## 2024-02-10 DIAGNOSIS — N943 Premenstrual tension syndrome: Secondary | ICD-10-CM | POA: Diagnosis not present

## 2024-02-12 ENCOUNTER — Other Ambulatory Visit (HOSPITAL_BASED_OUTPATIENT_CLINIC_OR_DEPARTMENT_OTHER): Payer: Self-pay | Admitting: Nurse Practitioner

## 2024-02-12 DIAGNOSIS — R599 Enlarged lymph nodes, unspecified: Secondary | ICD-10-CM

## 2024-02-12 DIAGNOSIS — R591 Generalized enlarged lymph nodes: Secondary | ICD-10-CM | POA: Diagnosis not present

## 2024-02-13 ENCOUNTER — Ambulatory Visit (HOSPITAL_BASED_OUTPATIENT_CLINIC_OR_DEPARTMENT_OTHER)
Admission: RE | Admit: 2024-02-13 | Discharge: 2024-02-13 | Disposition: A | Discharge status: Home / Self Care | Source: Ambulatory Visit | Attending: Nurse Practitioner | Admitting: Nurse Practitioner

## 2024-02-13 DIAGNOSIS — R599 Enlarged lymph nodes, unspecified: Secondary | ICD-10-CM | POA: Diagnosis not present

## 2024-02-13 DIAGNOSIS — R590 Localized enlarged lymph nodes: Secondary | ICD-10-CM | POA: Diagnosis not present

## 2024-05-25 DIAGNOSIS — J22 Unspecified acute lower respiratory infection: Secondary | ICD-10-CM | POA: Diagnosis not present

## 2024-06-03 DIAGNOSIS — F321 Major depressive disorder, single episode, moderate: Secondary | ICD-10-CM | POA: Diagnosis not present

## 2024-06-03 DIAGNOSIS — F902 Attention-deficit hyperactivity disorder, combined type: Secondary | ICD-10-CM | POA: Diagnosis not present

## 2024-06-03 DIAGNOSIS — F3281 Premenstrual dysphoric disorder: Secondary | ICD-10-CM | POA: Diagnosis not present

## 2024-06-03 DIAGNOSIS — Z0000 Encounter for general adult medical examination without abnormal findings: Secondary | ICD-10-CM | POA: Diagnosis not present

## 2024-06-03 DIAGNOSIS — F419 Anxiety disorder, unspecified: Secondary | ICD-10-CM | POA: Diagnosis not present
